# Patient Record
Sex: Female | Born: 1945 | Race: White | Hispanic: No | Smoking: Never smoker
Health system: Southern US, Community
[De-identification: ages and names within clinical notes are randomized; demographics above are authoritative.]

## PROBLEM LIST (undated history)

## (undated) DIAGNOSIS — I10 Essential (primary) hypertension: Secondary | ICD-10-CM

## (undated) DIAGNOSIS — I639 Cerebral infarction, unspecified: Secondary | ICD-10-CM

## (undated) DIAGNOSIS — C801 Malignant (primary) neoplasm, unspecified: Secondary | ICD-10-CM

## (undated) DIAGNOSIS — E119 Type 2 diabetes mellitus without complications: Secondary | ICD-10-CM

## (undated) DIAGNOSIS — J45909 Unspecified asthma, uncomplicated: Secondary | ICD-10-CM

## (undated) DIAGNOSIS — I4891 Unspecified atrial fibrillation: Secondary | ICD-10-CM

---

## 2019-10-29 ENCOUNTER — Emergency Department: Payer: Medicare (Managed Care)

## 2019-10-29 ENCOUNTER — Emergency Department
Admission: EM | Admit: 2019-10-29 | Discharge: 2019-10-29 | Disposition: A | Discharge status: Home / Self Care | Payer: Medicare (Managed Care) | Attending: Emergency Medicine | Admitting: Emergency Medicine

## 2019-10-29 ENCOUNTER — Other Ambulatory Visit: Payer: Self-pay

## 2019-10-29 DIAGNOSIS — Y92480 Sidewalk as the place of occurrence of the external cause: Secondary | ICD-10-CM | POA: Insufficient documentation

## 2019-10-29 DIAGNOSIS — Y9301 Activity, walking, marching and hiking: Secondary | ICD-10-CM | POA: Insufficient documentation

## 2019-10-29 DIAGNOSIS — J45909 Unspecified asthma, uncomplicated: Secondary | ICD-10-CM | POA: Diagnosis not present

## 2019-10-29 DIAGNOSIS — Z8673 Personal history of transient ischemic attack (TIA), and cerebral infarction without residual deficits: Secondary | ICD-10-CM | POA: Diagnosis not present

## 2019-10-29 DIAGNOSIS — W101XXA Fall (on)(from) sidewalk curb, initial encounter: Secondary | ICD-10-CM | POA: Insufficient documentation

## 2019-10-29 DIAGNOSIS — I10 Essential (primary) hypertension: Secondary | ICD-10-CM | POA: Insufficient documentation

## 2019-10-29 DIAGNOSIS — S0081XA Abrasion of other part of head, initial encounter: Secondary | ICD-10-CM | POA: Diagnosis not present

## 2019-10-29 DIAGNOSIS — S60211A Contusion of right wrist, initial encounter: Secondary | ICD-10-CM | POA: Insufficient documentation

## 2019-10-29 DIAGNOSIS — E119 Type 2 diabetes mellitus without complications: Secondary | ICD-10-CM | POA: Insufficient documentation

## 2019-10-29 DIAGNOSIS — Y998 Other external cause status: Secondary | ICD-10-CM | POA: Insufficient documentation

## 2019-10-29 DIAGNOSIS — W19XXXA Unspecified fall, initial encounter: Secondary | ICD-10-CM

## 2019-10-29 DIAGNOSIS — Z7901 Long term (current) use of anticoagulants: Secondary | ICD-10-CM | POA: Diagnosis not present

## 2019-10-29 DIAGNOSIS — S6991XA Unspecified injury of right wrist, hand and finger(s), initial encounter: Secondary | ICD-10-CM | POA: Diagnosis present

## 2019-10-29 HISTORY — DX: Essential (primary) hypertension: I10

## 2019-10-29 HISTORY — DX: Unspecified asthma, uncomplicated: J45.909

## 2019-10-29 HISTORY — DX: Cerebral infarction, unspecified: I63.9

## 2019-10-29 HISTORY — DX: Unspecified atrial fibrillation: I48.91

## 2019-10-29 HISTORY — DX: Type 2 diabetes mellitus without complications: E11.9

## 2019-10-29 HISTORY — DX: Malignant (primary) neoplasm, unspecified: C80.1

## 2019-10-29 LAB — BASIC METABOLIC PANEL
Anion gap: 12 (ref 5–15)
BUN: 21 mg/dL (ref 8–23)
CO2: 25 mmol/L (ref 22–32)
Calcium: 9.2 mg/dL (ref 8.9–10.3)
Chloride: 104 mmol/L (ref 98–111)
Creatinine, Ser: 1.13 mg/dL — ABNORMAL HIGH (ref 0.44–1.00)
GFR calc Af Amer: 55 mL/min — ABNORMAL LOW (ref >60)
GFR calc non Af Amer: 47 mL/min — ABNORMAL LOW (ref >60)
Glucose, Bld: 133 mg/dL — ABNORMAL HIGH (ref 70–99)
Potassium: 3.4 mmol/L — ABNORMAL LOW (ref 3.5–5.1)
Sodium: 141 mmol/L (ref 135–145)

## 2019-10-29 LAB — CBC
HCT: 34.7 % — ABNORMAL LOW (ref 36.0–46.0)
Hemoglobin: 10.8 g/dL — ABNORMAL LOW (ref 12.0–15.0)
MCH: 28.3 pg (ref 26.0–34.0)
MCHC: 31.1 g/dL (ref 30.0–36.0)
MCV: 90.8 fL (ref 80.0–100.0)
Platelets: 183 10*3/uL (ref 150–400)
RBC: 3.82 MIL/uL — ABNORMAL LOW (ref 3.87–5.11)
RDW: 13.1 % (ref 11.5–15.5)
WBC: 9.8 10*3/uL (ref 4.0–10.5)
nRBC: 0 % (ref 0.0–0.2)

## 2019-10-29 LAB — TROPONIN I (HIGH SENSITIVITY): Troponin I (High Sensitivity): 5 ng/L (ref <18)

## 2019-10-29 LAB — PROTIME-INR
INR: 2 — ABNORMAL HIGH (ref 0.8–1.2)
Prothrombin Time: 21.8 seconds — ABNORMAL HIGH (ref 11.4–15.2)

## 2019-10-29 MED ORDER — OXYCODONE-ACETAMINOPHEN 5-325 MG PO TABS: 1.0000 | ORAL_TABLET | ORAL | 0 refills | Status: AC | PRN

## 2019-10-29 MED ORDER — SODIUM CHLORIDE 0.9% FLUSH: 3.0000 mL | Freq: Once | INTRAVENOUS | Status: DC

## 2019-10-29 MED ORDER — OXYCODONE-ACETAMINOPHEN 5-325 MG PO TABS
1.0000 | ORAL_TABLET | Freq: Once | ORAL | Status: AC
  Administered 2019-10-29: 1 via ORAL
  Filled 2019-10-29: qty 1

## 2019-10-29 NOTE — ED Provider Notes (Signed)
[facility] Emergency Department Provider Note   ____________________________________________   First MD Initiated Contact with Patient 10/29/19 1647     (approximate)  I have reviewed the triage vital signs and the nursing notes.   HISTORY  Chief Complaint Fall    HPI Madison Shepherd is a 74 y.o. female with past medical history of hypertension, diabetes, stroke, asthma, and A. fib on Xarelto who presents to the ED following fall.  51 of history is obtained from patient's daughter, who states that patient tripped on a curb and fell forward onto her face and right arm yesterday.  She did not lose consciousness and had mild pain initially, but has complained of more severe pain in her right arm and face overnight into today.  She has not had any headache, neck pain, numbness, or weakness.  She also denies any pain in her chest, abdomen, bilateral hips or lower extremities.  She took some Tylenol last night without significant relief.  The [place] the [place], patient is currently visiting the area from [place].        Past Medical History:  Diagnosis Date  . A-fib ([place])   . Asthma   . Diabetes mellitus without complication ([place])   . Hypertension   . skin   . Stroke [facility])     There are no problems to display for this patient.   History reviewed. No pertinent surgical history.  Prior to Admission medications   Medication Sig Start Date End Date Taking? Authorizing Provider  oxyCODONE-acetaminophen (PERCOCET) 5-325 MG tablet Take 1 tablet by mouth every 4 (four) hours as needed for severe pain. 10/29/19 10/28/20  Blake Divine, MD    Allergies Other  No family history on file.  Social History Social History   Tobacco Use  . Smoking status: Never Smoker  . Smokeless tobacco: Never Used  Substance Use Topics  . Alcohol use: Never  . Drug use: Never    Review of Systems  Constitutional: No  fever/chills Eyes: No visual changes. ENT: No sore throat. Cardiovascular: Denies chest pain. Respiratory: Denies shortness of breath. Gastrointestinal: No abdominal pain.  No nausea, no vomiting.  No diarrhea.  No constipation. Genitourinary: Negative for dysuria. Musculoskeletal: Negative for back pain.  Positive for right arm pain. Skin: Negative for rash. Neurological: Negative for headaches, focal weakness or numbness.  ____________________________________________   PHYSICAL EXAM:  VITAL SIGNS: ED Triage Vitals [10/29/19 1319]  Enc Vitals Group     BP 127/66     Pulse Rate 67     Resp 18     Temp 98.3 F (36.8 C)     Temp src      SpO2 96 %     Weight 180 lb (81.6 kg)     Height 5\' 2"  (1.575 m)     Head Circumference      Peak Flow      Pain Score 7     Pain Loc      Pain Edu?      Excl. in [place]?     Constitutional: Alert and oriented. Eyes: Conjunctivae are normal. Head: Small abrasions to right lower face with no apparent hematomas or facial bony tenderness. Nose: No congestion/rhinnorhea. Mouth/Throat: Mucous membranes are moist. Neck: Normal ROM, no midline cervical spine tenderness. Cardiovascular: Normal rate, regular rhythm. Grossly normal heart sounds. Respiratory: Normal respiratory effort.  No retractions. Lungs CTAB. Gastrointestinal: Soft and nontender. No distention. Genitourinary: deferred Musculoskeletal: No  lower extremity tenderness nor edema.  No tenderness to bilateral hips, pelvis stable.  No tenderness throughout left upper extremity.  Tenderness diffusely to right upper extremity, primarily at the area of right wrist and dorsum of right hand with associated swelling.  Tenderness also noted to right shoulder. Neurologic:  Normal speech and language. No gross focal neurologic deficits are appreciated. Skin:  Skin is warm, dry and intact. No rash noted. Psychiatric: Mood and affect are normal. Speech and behavior are  normal.  ____________________________________________   LABS (all labs ordered are listed, but only abnormal results are displayed)  Labs Reviewed  BASIC METABOLIC PANEL - Abnormal; Notable for the following components:      Result Value   Potassium 3.4 (*)    Glucose, Bld 133 (*)    Creatinine, Ser 1.13 (*)    GFR calc non Af Amer 47 (*)    GFR calc Af Amer 55 (*)    All other components within normal limits  CBC - Abnormal; Notable for the following components:   RBC 3.82 (*)    Hemoglobin 10.8 (*)    HCT 34.7 (*)    All other components within normal limits  PROTIME-INR - Abnormal; Notable for the following components:   Prothrombin Time 21.8 (*)    INR 2.0 (*)    All other components within normal limits  TROPONIN I (HIGH SENSITIVITY)   ____________________________________________  EKG  ED ECG REPORT I, Blake Divine, the attending physician, personally viewed and interpreted this ECG.   Date: 10/29/2019  EKG Time: 13:35  Rate: 68  Rhythm: normal EKG, normal sinus rhythm, unchanged from previous tracings  Axis: Normal  Intervals:none  ST&T Change: None   PROCEDURES  Procedure(s) performed (including Critical Care):  Procedures   ____________________________________________   INITIAL IMPRESSION / ASSESSMENT AND PLAN / ED COURSE       74 year old female, currently anticoagulated on Xarelto for atrial fibrillation, presents to the ED following a mechanical fall yesterday where she tripped on a curb and fell forward onto the right side of her face and her right arm.  She primarily complains of pain around her right wrist and hand, does have swelling here but no obvious deformity.  She also has abrasions to her face but no facial bony tenderness or cervical spine tenderness.  CT head, cervical spine, and maxillofacial are negative for acute process.  Chest x-ray does show possible compression deformity at T12, however patient has no focal tenderness at this  area.  She has no obvious signs of trauma to her trunk with no abdominal tenderness, hip tenderness, or lower extremity tenderness.  X-rays of her right wrist show advanced osteoarthritis but no acute process.  Given pain extending up her right arm, we will also assess with x-rays of right elbow and shoulder.  Lab work is unremarkable and EKG shows no evidence of arrhythmia or ischemia.  Additional x-rays are negative for acute process, patient reports improvement in pain following dose of Percocet.  Given swelling and pain to her right wrist and hand along with severe osteoarthritis, we will place her in a volar short arm splint for comfort.  Patient also requesting sling for comfort, which we will provide but she was counseled to mobilize her shoulder a couple of times per day to avoid frozen shoulder.  She was counseled to follow up with her PCP when she returns to [place], otherwise return to the ED for new or worsening symptoms.  Patient agrees with plan.  ____________________________________________   FINAL CLINICAL IMPRESSION(S) / ED DIAGNOSES  Final diagnoses:  Fall, initial encounter  Contusion of right wrist, initial encounter     ED Discharge Orders         Ordered    oxyCODONE-acetaminophen (PERCOCET) 5-325 MG tablet  Every 4 hours PRN     10/29/19 1854           Note:  This document was prepared using Dragon voice recognition software and may include unintentional dictation errors.   Blake Divine, MD 10/29/19 [phone]

## 2019-10-29 NOTE — ED Triage Notes (Addendum)
Pt comes via POV with c/o fall. Pt states she fell yesterday at a rest stop while traveling down here to visit family. Pt states she just didn't step up enough and fell face forward. Pt denies any LOC. Pt states she is on blood thinners. Pt states pain to walk and pain in her back.  Pt also c/o right arm and shoulder pain. Pt has abrasions noted to right cheek, nose and lip area.  Pt states severe pain in arm. Pt has redness and swelling noted to armand wrist.  Pt tearful in triage.

## 2019-10-29 NOTE — ED Notes (Signed)
This RN reviewed discharge instructions, follow-up care, prescriptions, cryotherapy, and need for elevation with patient. Patient verbalized understanding of all reviewed information.  Patient stable, with no distress noted at this time.

## 2019-10-29 NOTE — ED Notes (Signed)
See triage note- pt here after falling. Pt with bruising and swelling to left eye, cheek, nose and lip area. Pt guarding right arm.

## 2021-06-29 IMAGING — CT CT HEAD W/O CM
3 series · 16 of 47 positions shown, 19 images · non-contrast
Comparison: None.

CLINICAL DATA: Fall yesterday at a rest stop falling face forward.
On anticoagulation.

Facial trauma, penetrating fall
EXAM:
CT HEAD WITHOUT CONTRAST
TECHNIQUE: Contiguous axial images were obtained from the base of the skull
through the vertex without intravenous contrast.

[Series 2: head wo · axial · 0.42mm/px · z∈[+178,+313]mm · 10 of 33 slices shown, 13 images]
[im 3/33  brain]
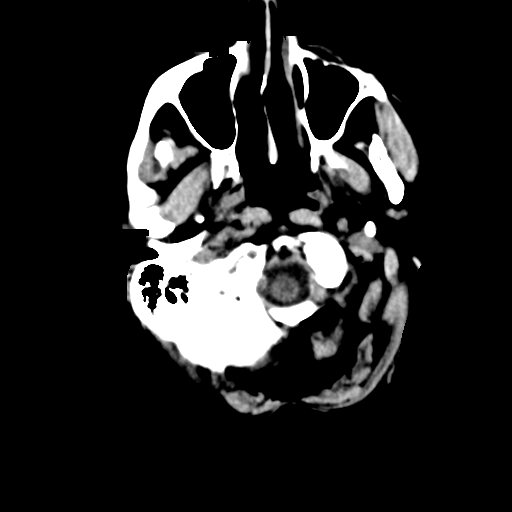
[im 3/33  bone]
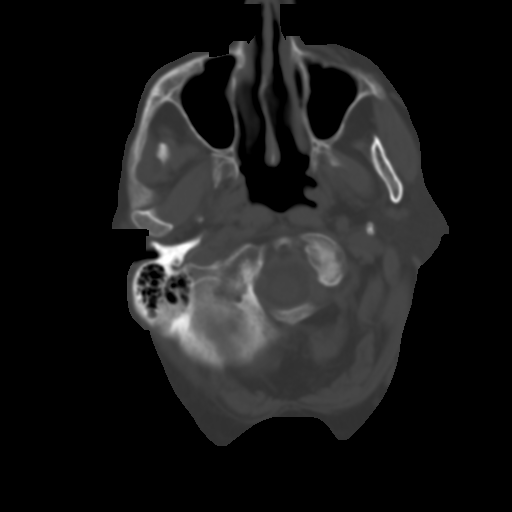
[im 6/33  brain]
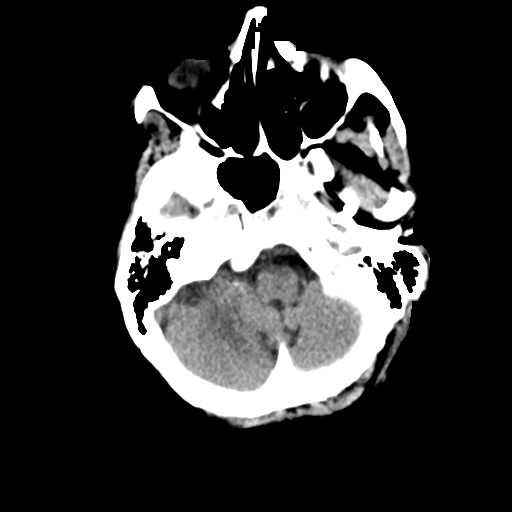
[im 9/33  brain]
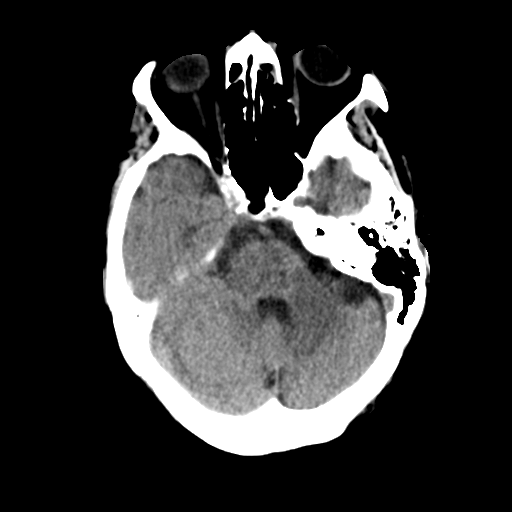
[im 12/33  brain]
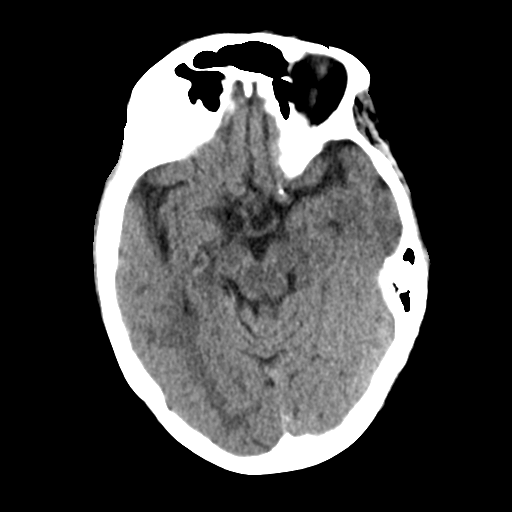
[im 15/33  brain]
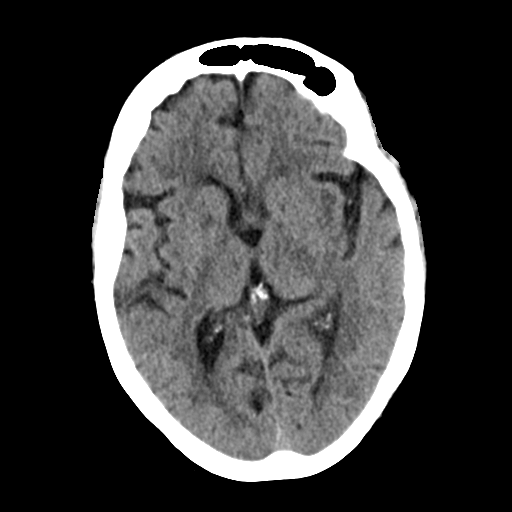
[im 15/33  bone]
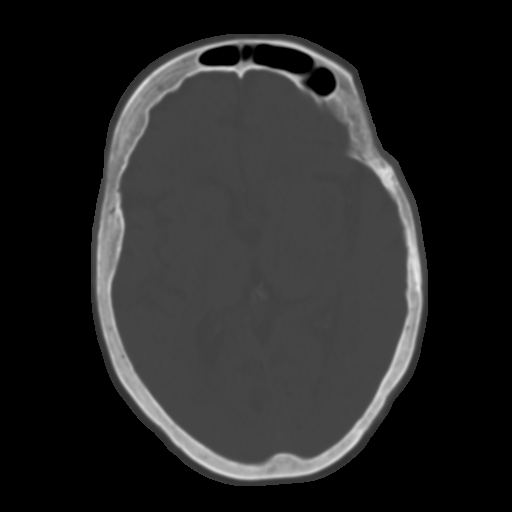
[im 18/33  brain]
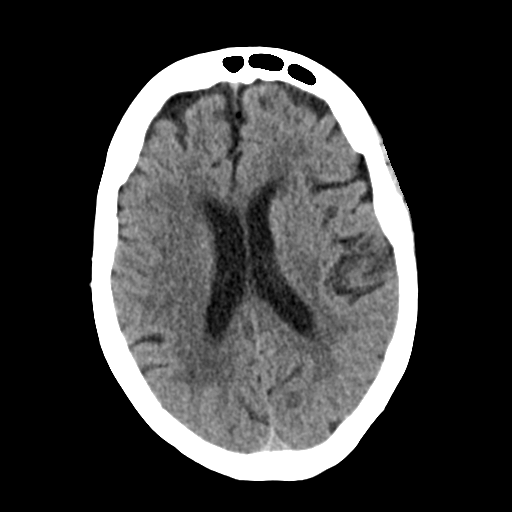
[im 21/33  brain]
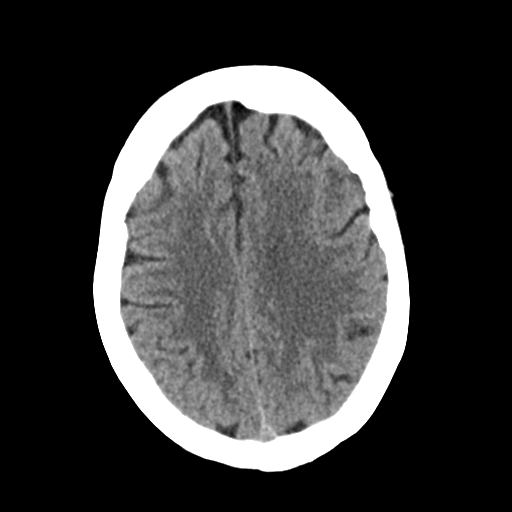
[im 25/33  brain]
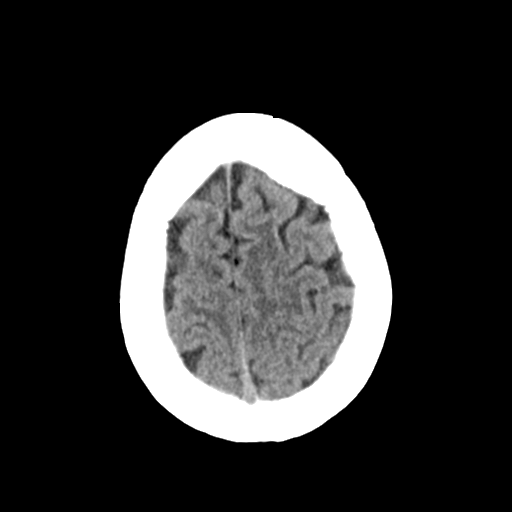
[im 27/33  brain]
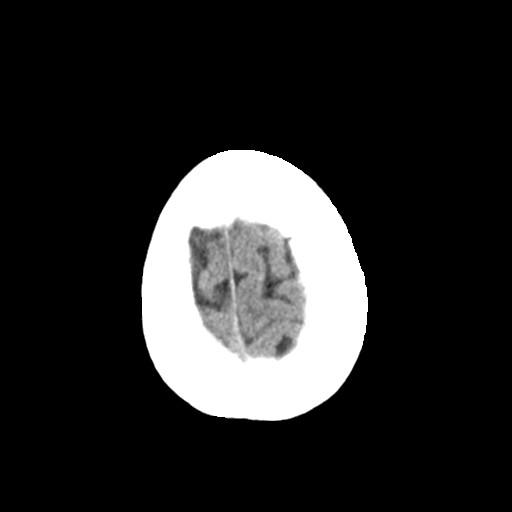
[im 27/33  bone]
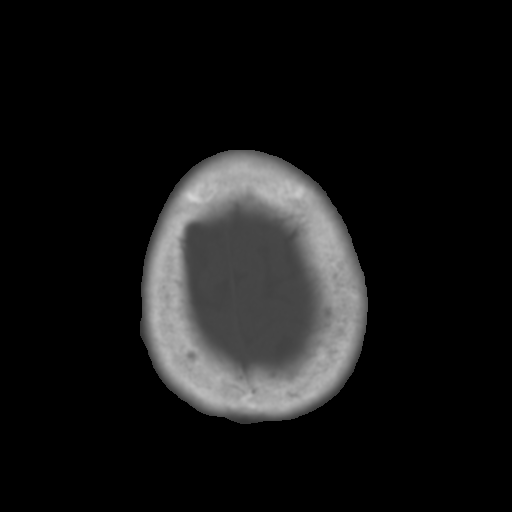
[im 30/33  brain]
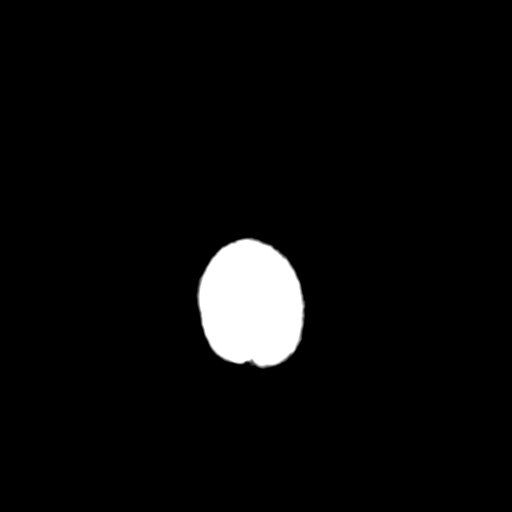

[Series 4: coronal soft tissue · coronal · 0.31mm/px · 3 of 66 slices shown]
[im 22/66  brain]
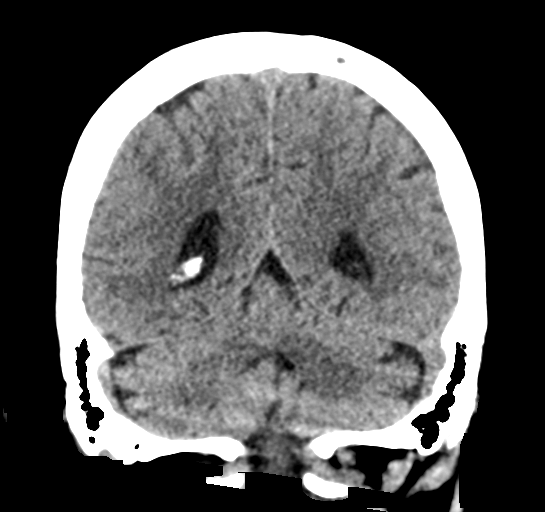
[im 29/66  brain]
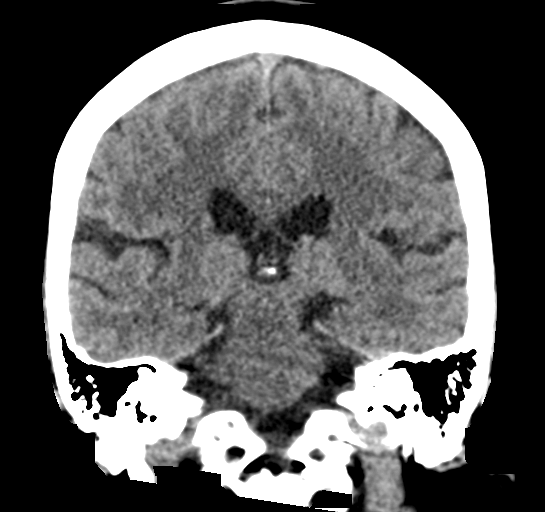
[im 37/66  brain]
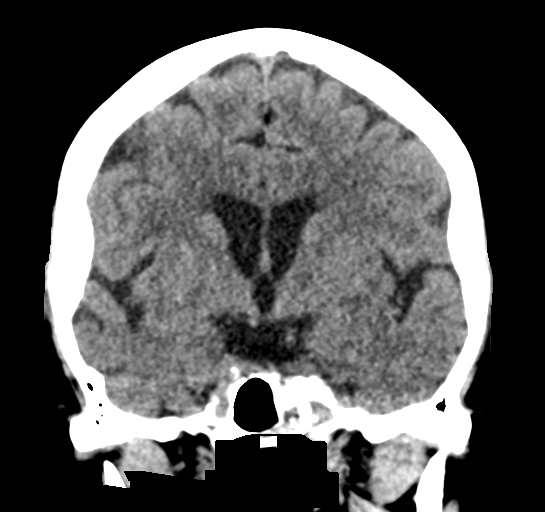

[Series 5: sagittal soft tissue · sagittal · 0.31mm/px · 3 of 50 slices shown]
[im 18/50  brain]
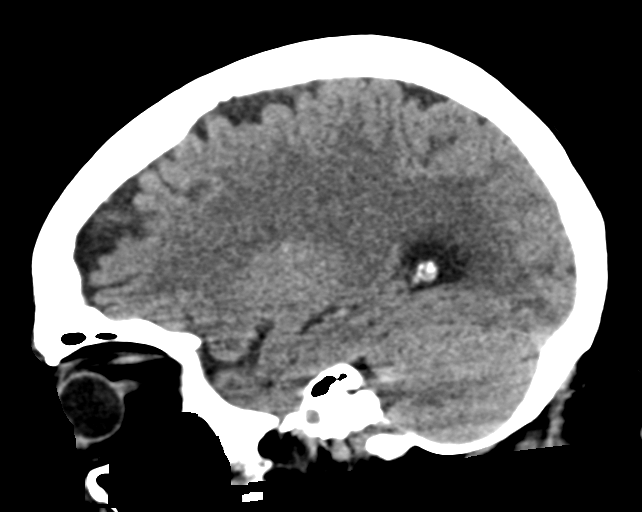
[im 25/50  brain]
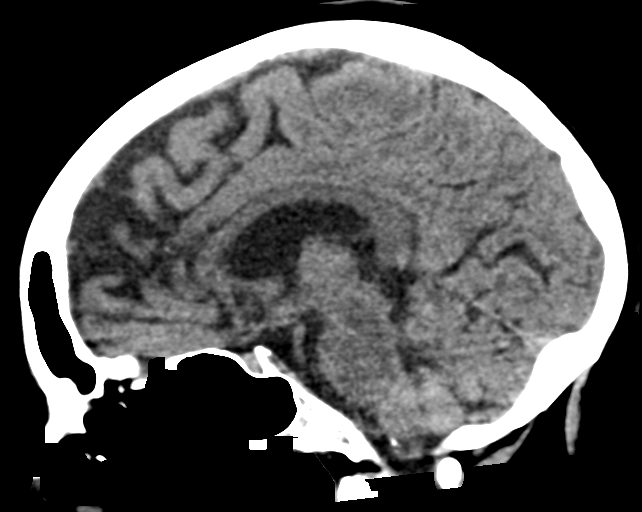
[im 32/50  brain]
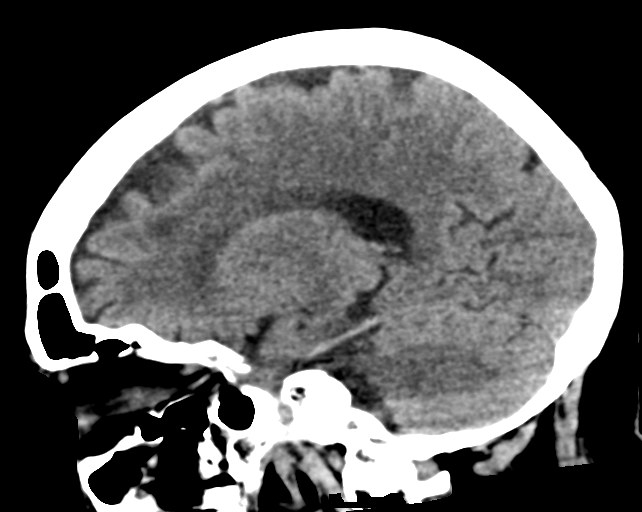

[16 of 47 positions shown; findings below may reference images not displayed]

FINDINGS: Brain: No intracranial hemorrhage, mass effect, or midline shift.
Brain volume is normal for age. No hydrocephalus. The basilar
cisterns are patent. Mild periventricular chronic small vessel
ischemia with remote lacunar infarcts the right thalamus and left
basal ganglia. No evidence of territorial infarct or acute ischemia.
No extra-axial or intracranial fluid collection.

Vascular: Atherosclerosis of skullbase vasculature without
hyperdense vessel or abnormal calcification.

Skull: No fracture or focal lesion.

Sinuses/Orbits: Assessed on concurrent face CT, reported separately

Other: None.
IMPRESSION: 1. No acute intracranial abnormality. No skull fracture.
2. Mild chronic small vessel ischemia. Remote lacunar infarcts in
the right thalamus and left basal ganglia.

## 2021-06-29 IMAGING — CT CT CERVICAL SPINE W/O CM
3 of 4 series · 10 of 33 positions shown, 12 images · non-contrast
Comparison: None.

CLINICAL DATA: Fall yesterday at a rest stop falling face forward.
On anticoagulation.

Polytrauma, critical, head/C-spine injury suspected
EXAM:
CT CERVICAL SPINE WITHOUT CONTRAST
TECHNIQUE: Multidetector CT imaging of the cervical spine was performed without
intravenous contrast. Multiplanar CT image reconstructions were also
generated.

[Series 5: orthogonal axials · axial · 0.28mm/px · z∈[+18,+130]mm · 2 of 134 slices shown, 3 images]
[im 34/134  soft-tissue]
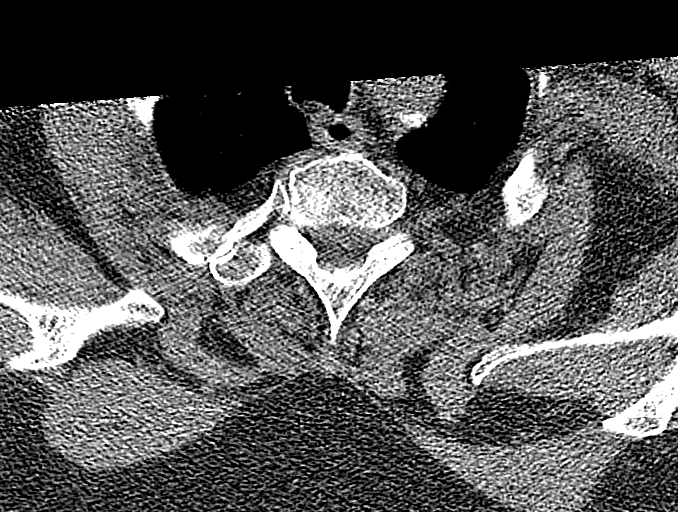
[im 34/134  bone]
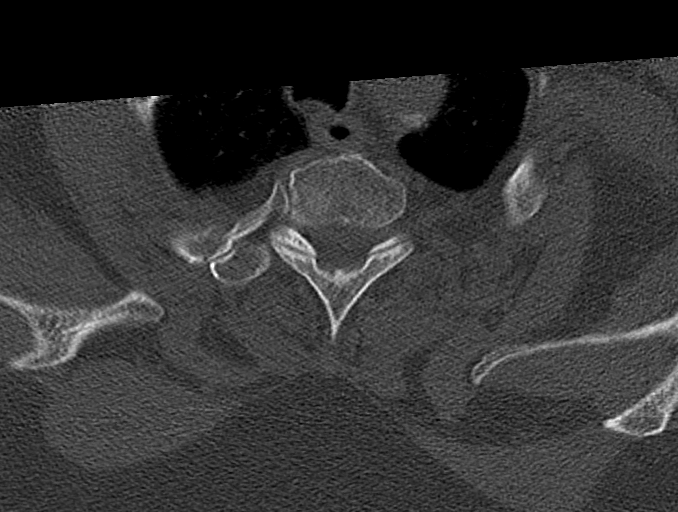
[im 100/134  bone]
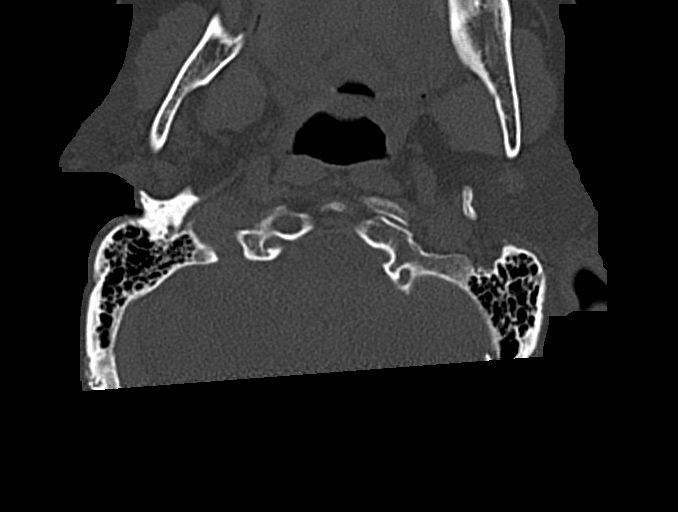

[Series 6: sagittal bone · sagittal · 0.28mm/px · 5 of 43 slices shown, 6 images]
[im 15/43  bone]
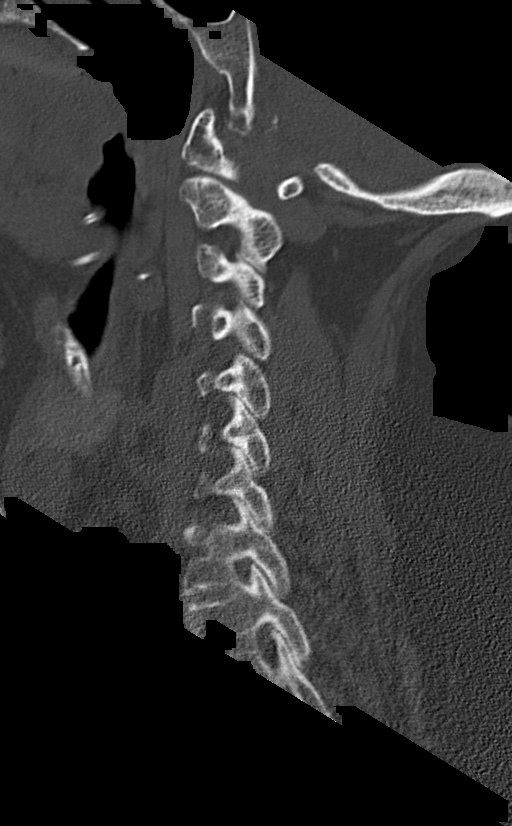
[im 18/43  bone]
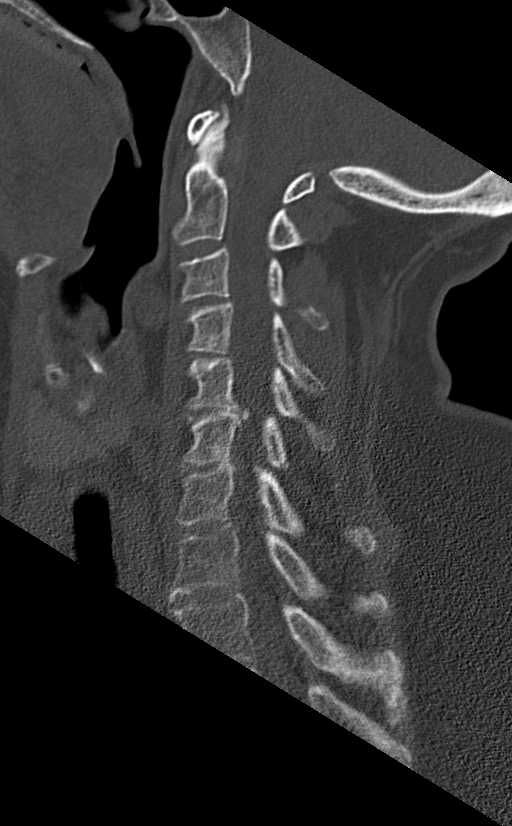
[im 22/43  soft-tissue]
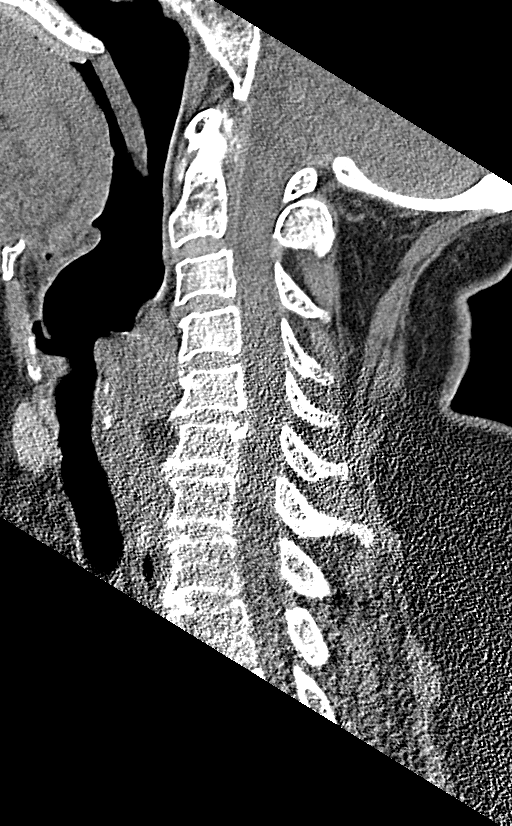
[im 22/43  bone]
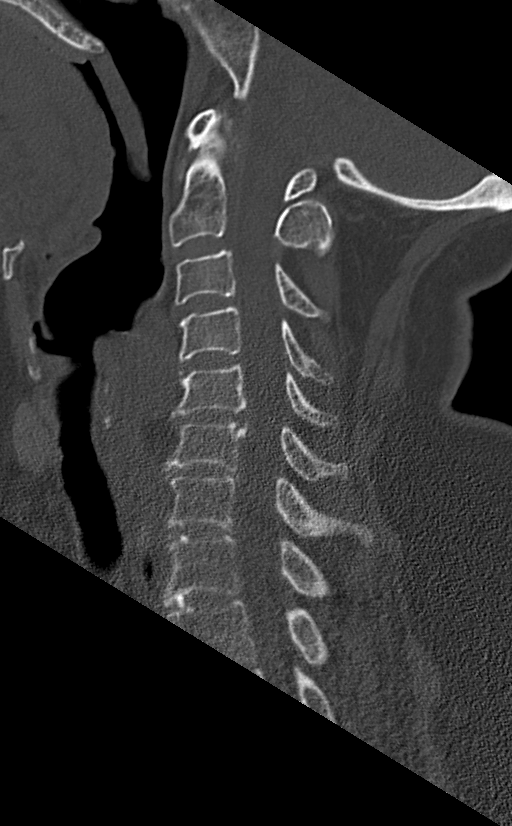
[im 25/43  bone]
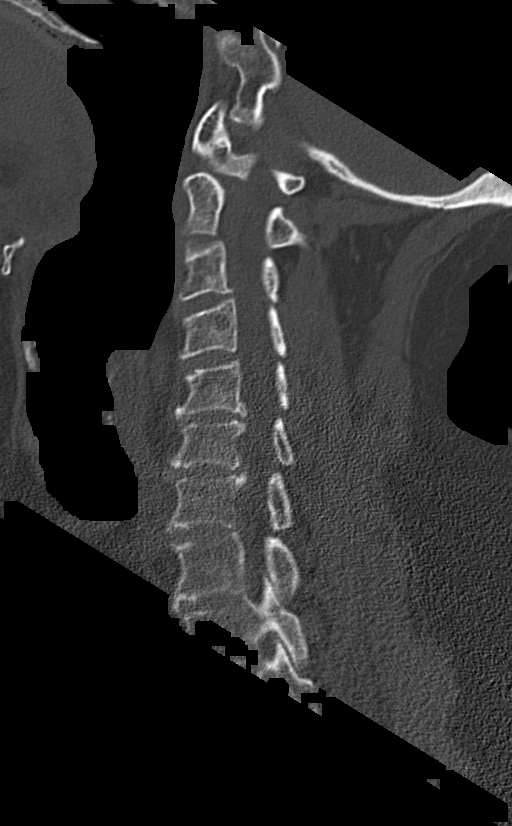
[im 29/43  bone]
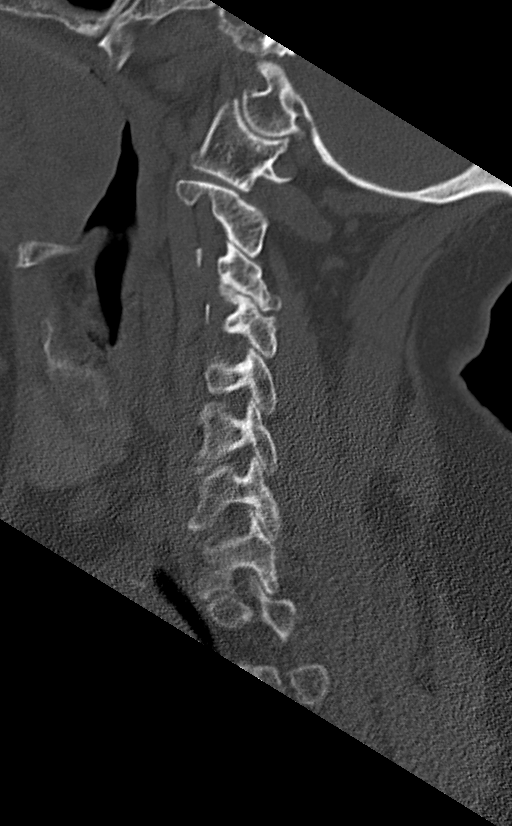

[Series 7: coronal bone · coronal · 0.24mm/px · 3 of 71 slices shown]
[im 22/71  bone]
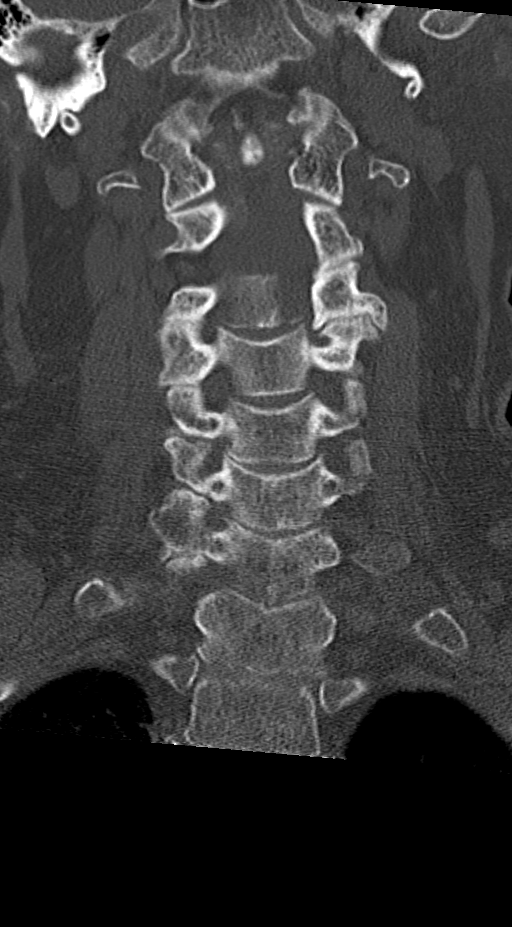
[im 31/71  bone]
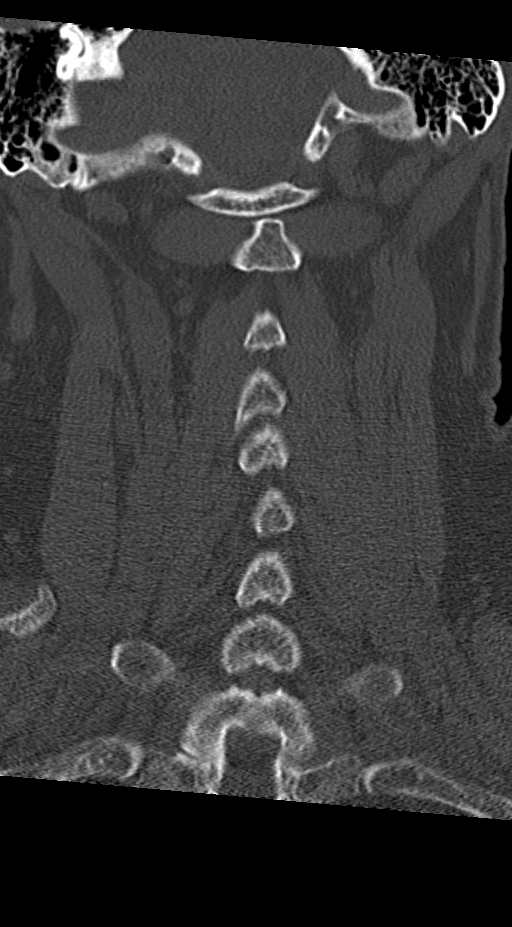
[im 40/71  bone]
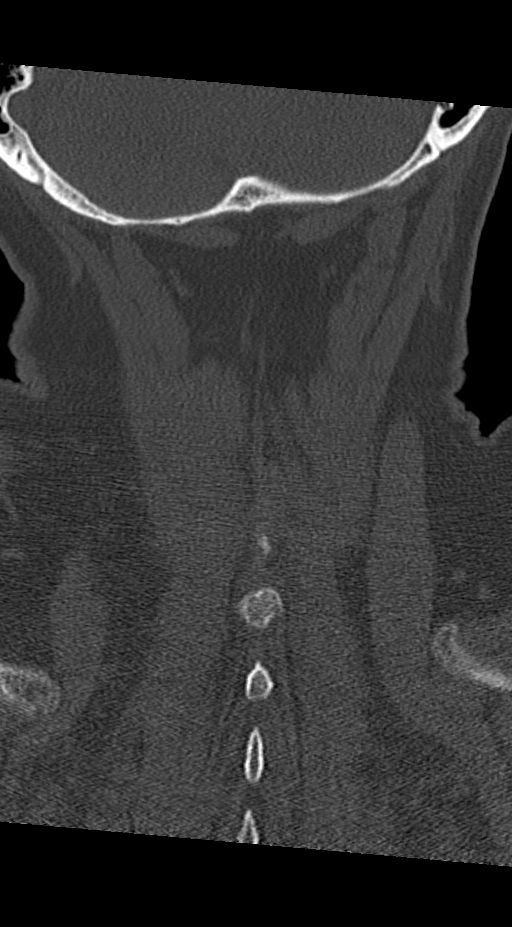

[10 of 33 positions shown; findings below may reference images not displayed]

FINDINGS: Alignment: Straightening of normal lordosis. No traumatic
subluxation. Trace anterolisthesis of C3 on C4 which is likely
degenerative.

Skull base and vertebrae: No acute fracture. Vertebral body heights
are maintained. The dens and skull base are intact.

Soft tissues and spinal canal: No prevertebral fluid or swelling. No
visible canal hematoma.

Disc levels: Disc space narrowing and endplate spurring most
prominent at C5-C6 causing mild mass effect on the bony canal and
neural foramen. Additional degenerative disc disease and facet
hypertrophy at multiple levels.

Upper chest: No acute findings.

Other: Carotid calcifications.
IMPRESSION: 1. No acute fracture or subluxation of the cervical spine.
2. Multilevel degenerative disc disease and facet hypertrophy.
